# Patient Record
Sex: Female | Born: 1970
Health system: Southern US, Community
[De-identification: ages and names within clinical notes are randomized; demographics above are authoritative.]

## PROBLEM LIST (undated history)

## (undated) DIAGNOSIS — F32A Depression, unspecified: Secondary | ICD-10-CM

## (undated) DIAGNOSIS — R32 Unspecified urinary incontinence: Secondary | ICD-10-CM

## (undated) DIAGNOSIS — J439 Emphysema, unspecified: Secondary | ICD-10-CM

## (undated) DIAGNOSIS — M1990 Unspecified osteoarthritis, unspecified site: Secondary | ICD-10-CM

## (undated) DIAGNOSIS — J301 Allergic rhinitis due to pollen: Secondary | ICD-10-CM

## (undated) DIAGNOSIS — F4310 Post-traumatic stress disorder, unspecified: Secondary | ICD-10-CM

## (undated) DIAGNOSIS — Z21 Asymptomatic human immunodeficiency virus [HIV] infection status: Secondary | ICD-10-CM

## (undated) DIAGNOSIS — F329 Major depressive disorder, single episode, unspecified: Secondary | ICD-10-CM

## (undated) DIAGNOSIS — A630 Anogenital (venereal) warts: Secondary | ICD-10-CM

## (undated) DIAGNOSIS — T7840XA Allergy, unspecified, initial encounter: Secondary | ICD-10-CM

## (undated) DIAGNOSIS — F603 Borderline personality disorder: Secondary | ICD-10-CM

## (undated) DIAGNOSIS — E079 Disorder of thyroid, unspecified: Secondary | ICD-10-CM

## (undated) DIAGNOSIS — R519 Headache, unspecified: Secondary | ICD-10-CM

## (undated) DIAGNOSIS — E119 Type 2 diabetes mellitus without complications: Secondary | ICD-10-CM

## (undated) DIAGNOSIS — R51 Headache: Secondary | ICD-10-CM

## (undated) DIAGNOSIS — J45909 Unspecified asthma, uncomplicated: Secondary | ICD-10-CM

## (undated) DIAGNOSIS — B20 Human immunodeficiency virus [HIV] disease: Secondary | ICD-10-CM

## (undated) HISTORY — DX: Depression, unspecified: F32.A

## (undated) HISTORY — DX: Borderline personality disorder: F60.3

## (undated) HISTORY — DX: Headache: R51

## (undated) HISTORY — DX: Emphysema, unspecified: J43.9

## (undated) HISTORY — DX: Unspecified urinary incontinence: R32

## (undated) HISTORY — DX: Major depressive disorder, single episode, unspecified: F32.9

## (undated) HISTORY — DX: Headache, unspecified: R51.9

## (undated) HISTORY — DX: Asymptomatic human immunodeficiency virus (hiv) infection status: Z21

## (undated) HISTORY — DX: Disorder of thyroid, unspecified: E07.9

## (undated) HISTORY — DX: Unspecified osteoarthritis, unspecified site: M19.90

## (undated) HISTORY — DX: Anogenital (venereal) warts: A63.0

## (undated) HISTORY — DX: Human immunodeficiency virus (HIV) disease: B20

## (undated) HISTORY — DX: Allergy, unspecified, initial encounter: T78.40XA

## (undated) HISTORY — DX: Type 2 diabetes mellitus without complications: E11.9

## (undated) HISTORY — DX: Post-traumatic stress disorder, unspecified: F43.10

## (undated) HISTORY — DX: Allergic rhinitis due to pollen: J30.1

## (undated) HISTORY — DX: Unspecified asthma, uncomplicated: J45.909

---

## 1994-10-28 HISTORY — PX: TUBAL LIGATION: SHX77

## 2014-12-08 DIAGNOSIS — F4310 Post-traumatic stress disorder, unspecified: Secondary | ICD-10-CM | POA: Diagnosis not present

## 2015-04-17 DIAGNOSIS — J40 Bronchitis, not specified as acute or chronic: Secondary | ICD-10-CM | POA: Diagnosis not present

## 2015-04-17 DIAGNOSIS — F17200 Nicotine dependence, unspecified, uncomplicated: Secondary | ICD-10-CM | POA: Diagnosis not present

## 2015-04-17 DIAGNOSIS — J449 Chronic obstructive pulmonary disease, unspecified: Secondary | ICD-10-CM | POA: Diagnosis not present

## 2015-04-17 DIAGNOSIS — J45909 Unspecified asthma, uncomplicated: Secondary | ICD-10-CM | POA: Diagnosis not present

## 2015-04-17 DIAGNOSIS — Z880 Allergy status to penicillin: Secondary | ICD-10-CM | POA: Diagnosis not present

## 2015-04-17 DIAGNOSIS — R05 Cough: Secondary | ICD-10-CM | POA: Diagnosis not present

## 2015-07-27 DIAGNOSIS — S93402A Sprain of unspecified ligament of left ankle, initial encounter: Secondary | ICD-10-CM | POA: Diagnosis not present

## 2015-07-27 DIAGNOSIS — M79672 Pain in left foot: Secondary | ICD-10-CM | POA: Diagnosis not present

## 2015-07-27 DIAGNOSIS — S0990XA Unspecified injury of head, initial encounter: Secondary | ICD-10-CM | POA: Diagnosis not present

## 2015-07-27 DIAGNOSIS — S14109A Unspecified injury at unspecified level of cervical spinal cord, initial encounter: Secondary | ICD-10-CM | POA: Diagnosis not present

## 2015-07-27 DIAGNOSIS — M25572 Pain in left ankle and joints of left foot: Secondary | ICD-10-CM | POA: Diagnosis not present

## 2015-07-27 DIAGNOSIS — S79912A Unspecified injury of left hip, initial encounter: Secondary | ICD-10-CM | POA: Diagnosis not present

## 2015-07-27 DIAGNOSIS — S161XXA Strain of muscle, fascia and tendon at neck level, initial encounter: Secondary | ICD-10-CM | POA: Diagnosis not present

## 2015-07-27 DIAGNOSIS — S7002XA Contusion of left hip, initial encounter: Secondary | ICD-10-CM | POA: Diagnosis not present

## 2016-05-30 ENCOUNTER — Ambulatory Visit (INDEPENDENT_AMBULATORY_CARE_PROVIDER_SITE_OTHER): Payer: Medicare Other | Admitting: Family Medicine

## 2016-05-30 ENCOUNTER — Encounter: Payer: Self-pay | Admitting: Family Medicine

## 2016-05-30 VITALS — BP 132/90 | HR 71 | Temp 98.2°F | Resp 12 | Ht 64.0 in | Wt 209.0 lb

## 2016-05-30 DIAGNOSIS — F411 Generalized anxiety disorder: Secondary | ICD-10-CM | POA: Insufficient documentation

## 2016-05-30 DIAGNOSIS — F609 Personality disorder, unspecified: Secondary | ICD-10-CM

## 2016-05-30 DIAGNOSIS — E039 Hypothyroidism, unspecified: Secondary | ICD-10-CM

## 2016-05-30 DIAGNOSIS — E1149 Type 2 diabetes mellitus with other diabetic neurological complication: Secondary | ICD-10-CM | POA: Insufficient documentation

## 2016-05-30 DIAGNOSIS — J4520 Mild intermittent asthma, uncomplicated: Secondary | ICD-10-CM

## 2016-05-30 MED ORDER — ALBUTEROL SULFATE HFA 108 (90 BASE) MCG/ACT IN AERS: 2.0000 | INHALATION_SPRAY | Freq: Four times a day (QID) | RESPIRATORY_TRACT | 2 refills | Status: DC | PRN

## 2016-05-30 MED ORDER — MONTELUKAST SODIUM 10 MG PO TABS: 10.0000 mg | ORAL_TABLET | Freq: Every day | ORAL | 3 refills | Status: DC

## 2016-05-30 MED ORDER — LAMOTRIGINE 100 MG PO TABS: 100.0000 mg | ORAL_TABLET | Freq: Two times a day (BID) | ORAL | 2 refills | Status: DC

## 2016-05-30 MED ORDER — QUETIAPINE FUMARATE 200 MG PO TABS: 200.0000 mg | ORAL_TABLET | Freq: Every day | ORAL | 1 refills | Status: DC
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
## 2016-05-30 NOTE — Progress Notes (Signed)
Pre visit review using our clinic review tool, if applicable. No additional management support is needed unless otherwise documented below in the visit note. 

## 2016-05-30 NOTE — Patient Instructions (Addendum)
A few things to remember from today's visit:   Personality disorder - Plan: Ambulatory referral to Psychiatry  Generalized anxiety disorder - Plan: Ambulatory referral to Psychiatry  Diabetes mellitus type 2 with neurological manifestations (HCC) - Plan: Hemoglobin A1c, Comprehensive metabolic panel, Microalbumin/Creatinine Ratio, Urine  Hypothyroidism, unspecified hypothyroidism type - Plan: TSH, T4, free   We have ordered labs or studies at this visit.  It can take up to 1-2 weeks for results and processing. IF results require follow up or explanation, we will call you with instructions. Clinically stable results will be released to your [facility]. If you have not heard from [place] or cannot find your results in [facility] in 2 weeks please contact our office at [phone].  If you are not yet signed up for [facility], please consider signing up   Please be sure medication list is accurate. If a new problem present, please set up appointment sooner than planned today.

## 2016-06-12 ENCOUNTER — Other Ambulatory Visit: Payer: Medicare Other

## 2016-06-17 ENCOUNTER — Other Ambulatory Visit: Payer: Medicare Other

## 2016-07-12 ENCOUNTER — Ambulatory Visit: Payer: Medicare Other | Admitting: Family Medicine

## 2016-07-12 DIAGNOSIS — Z0289 Encounter for other administrative examinations: Secondary | ICD-10-CM

## 2016-07-25 ENCOUNTER — Telehealth: Payer: Self-pay | Admitting: Family Medicine

## 2016-07-25 DIAGNOSIS — F609 Personality disorder, unspecified: Secondary | ICD-10-CM

## 2016-07-25 MED ORDER — QUETIAPINE FUMARATE 200 MG PO TABS: 200.0000 mg | ORAL_TABLET | Freq: Every day | ORAL | 0 refills | Status: DC

## 2016-07-25 NOTE — Telephone Encounter (Signed)
Rx sent 

## 2016-07-25 NOTE — Telephone Encounter (Signed)
° °  Pt request refill of the following:     QUEtiapine (SEROQUEL) 200 MG tablet   Phamacy:  [facility]

## 2016-07-30 ENCOUNTER — Encounter: Payer: Self-pay | Admitting: Family Medicine

## 2016-07-30 ENCOUNTER — Ambulatory Visit (INDEPENDENT_AMBULATORY_CARE_PROVIDER_SITE_OTHER): Payer: Medicare Other | Admitting: Family Medicine

## 2016-07-30 ENCOUNTER — Other Ambulatory Visit (HOSPITAL_COMMUNITY)
Admission: RE | Admit: 2016-07-30 | Discharge: 2016-07-30 | Disposition: A | Discharge status: Home / Self Care | Payer: Medicare Other | Source: Ambulatory Visit | Attending: Family Medicine | Admitting: Family Medicine

## 2016-07-30 VITALS — BP 140/90 | HR 98 | Resp 12 | Ht 64.0 in | Wt 217.5 lb

## 2016-07-30 DIAGNOSIS — Z1239 Encounter for other screening for malignant neoplasm of breast: Secondary | ICD-10-CM

## 2016-07-30 DIAGNOSIS — Z124 Encounter for screening for malignant neoplasm of cervix: Secondary | ICD-10-CM

## 2016-07-30 DIAGNOSIS — N898 Other specified noninflammatory disorders of vagina: Secondary | ICD-10-CM | POA: Diagnosis not present

## 2016-07-30 DIAGNOSIS — Z01419 Encounter for gynecological examination (general) (routine) without abnormal findings: Secondary | ICD-10-CM | POA: Insufficient documentation

## 2016-07-30 DIAGNOSIS — E039 Hypothyroidism, unspecified: Secondary | ICD-10-CM | POA: Diagnosis not present

## 2016-07-30 DIAGNOSIS — Z1151 Encounter for screening for human papillomavirus (HPV): Secondary | ICD-10-CM | POA: Diagnosis not present

## 2016-07-30 DIAGNOSIS — F411 Generalized anxiety disorder: Secondary | ICD-10-CM

## 2016-07-30 DIAGNOSIS — Z114 Encounter for screening for human immunodeficiency virus [HIV]: Secondary | ICD-10-CM

## 2016-07-30 DIAGNOSIS — F609 Personality disorder, unspecified: Secondary | ICD-10-CM | POA: Diagnosis not present

## 2016-07-30 DIAGNOSIS — E1149 Type 2 diabetes mellitus with other diabetic neurological complication: Secondary | ICD-10-CM

## 2016-07-30 MED ORDER — LAMOTRIGINE 100 MG PO TABS: 100.0000 mg | ORAL_TABLET | Freq: Two times a day (BID) | ORAL | 1 refills | Status: AC

## 2016-07-30 MED ORDER — GABAPENTIN 600 MG PO TABS: 600.0000 mg | ORAL_TABLET | Freq: Three times a day (TID) | ORAL | 2 refills | Status: AC

## 2016-07-30 MED ORDER — QUETIAPINE FUMARATE 200 MG PO TABS
200.0000 mg | ORAL_TABLET | Freq: Every day | ORAL | 1 refills | Status: AC
Start: 2016-07-30 — End: ?

## 2016-07-30 NOTE — Patient Instructions (Addendum)
A few things to remember from today's visit:   Routine general medical examination at a health care facility - Plan: Pap IG and HPV (high risk) DNA detection, Chlamydia/Gonococcus/Trichomonas, NAA  Diabetes mellitus type 2 with neurological manifestations (HCC) - Plan: CMP, Hemoglobin A1c, Microalbumin/Creatinine Ratio, Urine, Lipid panel  Hypothyroidism, unspecified type - Plan: CMP, Lipid panel, TSH, T4, free, CBC  Personality disorder  Vaginal discharge - Plan: Chlamydia/Gonococcus/Trichomonas, NAA  Cervical cancer screening - Plan: Pap IG and HPV (high risk) DNA detection  Encounter for screening for HIV - Plan: HIV antibody (with reflex)    At least 150 minutes of moderate exercise per week, daily brisk walking for 15-30 min is a good exercise option. Healthy diet low in saturated (animal) fats and sweets and consisting of fresh fruits and vegetables, lean meats such as fish and white chicken and whole grains.   - Vaccines:  Tdap vaccine every 10 years.  Shingles vaccine recommended at age 43, could be given after 43 years of age but not sure about insurance coverage.  Pneumonia vaccines:  Prevnar 13 at 65 and Pneumovax at 66.  Screening recommendations for low/normal risk women:  Screening for diabetes at age 43-45 and every 3 years.  Cervical cancer prevention:  -HPV vaccination between 709-43 years old. -Pap smear starts at 43 years of age and continues periodically until 43 years old in low risk women. Pap smear every 3 years between 3021 and 582 years old. Pap smear every 3 years between women 30 and older if pap smear negative and HPV screening negative.   -Breast cancer: Mammogram: There is disagreement between experts about when to start screening in low risk asymptomatic female but recent recommendations are to start screening at 1140 and not later than 43 years old , every 1-2 years and after 43 yo q 2 years. Screening is recommended until 43 years old but some women  can continue screening depending of healthy issues.   Colon cancer screening: starts at 43 years old until 10963 years old.  Cholesterol disorder screening at age 43 and every 3 years.   Please call and schedule appt: [facility]  Address: [address], [place], [place] [address]  Hours:  Closing soon  8:30AM-5PM                       Phone: [phone]   Please be sure medication list is accurate. If a new problem present, please set up appointment sooner than planned today.

## 2016-07-30 NOTE — Progress Notes (Signed)
HPI:    [name] is a 45 y.o. female, who is here today to follow on last OV and also  for her routine physical/gyn examination.   She is also following on her last OV, Hx of personality disorder and generalized anxiety/PSTD, last OV (05/30/16) Seroquel and Lamictal were resumed. + Marijuana use.  Referral to psychiatrists was placed, she has not established with psychiatrists yet. Sleeping better (4 hours), still having her "mind racing", denies suicidal ideation. + Irritability. She states that she was on Clonazepam in the past and it helped.  Reporting poor concentration, anxiety, and tremor; all these stable. Today she mentions Hx of seizures "many years" ago, not sure about etiology and she is not clear if she really had seizures, "shaking."  Also she states that she was supposed to be on gabapentin 600 mg bid for anxiety and insomnia. Tolerating medications well, no side effect reported.    Also Hx of DM II diet controlled, asthma/COPD, and hypothyroidism among some. Last OV she did not have time to have labs done. Hx of hypothyroidism, she has not been on treatment for over a year.  She does not exercise regularly. She follows a healthy diet, father also has DM II.    She lives with father, moved with him in 03/2016. She is on disability due to psychiatric disorder.    Last Pap smear 3 years ago Genital warts, treated twice (perianal and vulvar), she has some residual lesions, not growing, not tender or pruritic.Also reporting scarring changes on site of perianal warts removal, she thinks this may be causing a "blockage" when trying to have a bowel movement. Hx of constipation, no changes in bowel habits, dyschezia, or blood in stool.   Hx of abnormal pap smear: Yes, about 21 years ago. Hx of STD's: HPV. Last HIV 3 years ago, she would like another one done. LMP: 07/17/16.  Currently she is not sexually active, she has had unprotected sex a few  months ago. She denies vaginal discharge or vaginal bleeding. She would like STD screening done.   Mammogram: never Tdap 10/2015.   FHx for gynecologic cancer: mother breast cancer FHx for colon cancer: grandparents (?) She is also requesting referral for eye exam.    Review of Systems  Constitutional: Negative for appetite change, fatigue, fever and unexpected weight change.  HENT: Negative for hearing loss, mouth sores, nosebleeds, sore throat, trouble swallowing and voice change.   Eyes: Negative for photophobia, pain and visual disturbance.  Respiratory: Negative for cough, shortness of breath and wheezing.   Cardiovascular: Negative for chest pain, palpitations and leg swelling.  Gastrointestinal: Positive for constipation. Negative for abdominal pain, blood in stool, nausea and vomiting.       No changes in bowel habits.  Endocrine: Negative for cold intolerance, heat intolerance, polydipsia, polyphagia and polyuria.  Genitourinary: Negative for decreased urine volume, difficulty urinating, dysuria, genital sores, hematuria, menstrual problem, vaginal bleeding and vaginal discharge.       Occasional stress urine incontinence. No breast tenderness or nipple discharge.  Musculoskeletal: Positive for arthralgias. Negative for gait problem and myalgias.  Skin: Negative for color change and rash.  Allergic/Immunologic: Positive for environmental allergies.  Neurological: Positive for tremors (hands mainly, stable) and numbness (occasional:feet and hands). Negative for seizures, syncope, speech difficulty, weakness and headaches.  Hematological: Negative for adenopathy. Does not bruise/bleed easily.  Psychiatric/Behavioral: Positive for decreased concentration, dysphoric mood and sleep disturbance. Negative for confusion, hallucinations and  suicidal ideas. The patient is nervous/anxious.   All other systems reviewed and are negative.     Current Outpatient Prescriptions on File  Prior to Visit  Medication Sig Dispense Refill  . albuterol (PROVENTIL HFA;VENTOLIN HFA) 108 (90 Base) MCG/ACT inhaler Inhale 2 puffs into the lungs every 6 (six) hours as needed for wheezing or shortness of breath. 1 Inhaler 2  . montelukast (SINGULAIR) 10 MG tablet Take 1 tablet (10 mg total) by mouth at bedtime. 90 tablet 3   No current facility-administered medications on file prior to visit.      Past Medical History:  Diagnosis Date  . Allergy   . Arthritis   . Asthma   . Borderline personality disorder   . Depression   . Diabetes mellitus without complication (HCC)   . Emphysema of lung (HCC)   . Frequent headaches   . Genital warts   . Hay fever   . HIV infection (HCC)    testing   . PTSD (post-traumatic stress disorder)   . Thyroid disease   . Urine incontinence    Not on File Family History  Problem Relation Age of Onset  . Arthritis Mother   . Hypertension Mother   . Diabetes Mother   . Arthritis Father   . Hypertension Father   . Diabetes Father   . Alcohol abuse Maternal Grandmother   . Cancer Maternal Grandmother   . Hypertension Maternal Grandmother   . Diabetes Maternal Grandmother   . Alcohol abuse Maternal Grandfather   . Cancer Maternal Grandfather   . Hypertension Maternal Grandfather   . Diabetes Maternal Grandfather   . Cancer Paternal Grandmother   . Hypertension Paternal Grandmother   . Diabetes Paternal Grandmother   . Cancer Paternal Grandfather   . Hypertension Paternal Grandfather   . Diabetes Paternal Grandfather     Social History   Social History  . Marital status: Single    Spouse name: N/A  . Number of children: N/A  . Years of education: N/A   Social History Main Topics  . Smoking status: Current Every Day Smoker    Types: Cigarettes  . Smokeless tobacco: None  . Alcohol use None  . Drug use:     Types: Marijuana  . Sexual activity: Not Asked   Other Topics Concern  . None   Social History Narrative  . None     Vitals:   07/30/16 1529  BP: 140/90  Pulse: 98  Resp: 12    O2 sat at RA 99%.   Body mass index is 37.33 kg/m.  Wt Readings from Last 3 Encounters:  07/30/16 217 lb 8 oz (98.7 kg)  05/30/16 209 lb (94.8 kg)       Physical Exam  Nursing note and vitals reviewed. Constitutional: She is oriented to person, place, and time. She appears well-developed. No distress.  HENT:  Head: Atraumatic.  Right Ear: Hearing, tympanic membrane, external ear and ear canal normal.  Left Ear: Hearing, tympanic membrane, external ear and ear canal normal.  Mouth/Throat: Uvula is midline, oropharynx is clear and moist and mucous membranes are normal.  Poor dentition   Eyes: Conjunctivae and EOM are normal. Pupils are equal, round, and reactive to light.  Neck: No thyroid mass and no thyromegaly (palpable) present.  Cardiovascular: Normal rate and regular rhythm.   No murmur heard. Pulses:      Dorsalis pedis pulses are 2+ on the right side, and 2+ on the left side.  Posterior tibial pulses are 2+ on the right side, and 2+ on the left side.  Respiratory: Effort normal and breath sounds normal. No respiratory distress.  GI: Soft. She exhibits no mass. There is no hepatomegaly. There is no tenderness.  Genitourinary: No breast swelling, tenderness or discharge. There is lesion (papular lesions x 2, no erythematous, sebaceous like, 2 mm) on the right labia. There is no rash or tenderness on the right labia. There is no rash or tenderness on the left labia. Uterus is not enlarged and not tender. Cervix exhibits discharge. Cervix exhibits no motion tenderness and no friability. Right adnexum displays no mass, no tenderness and no fullness. Left adnexum displays no mass, no tenderness and no fullness. There is tenderness in the vagina. No erythema or bleeding in the vagina. Vaginal discharge (whitish, non odorous) found.  Genitourinary Comments: Fibrocystic like changes upper outer quadrant  breast L>R. Pap smear collected.  Musculoskeletal: She exhibits no edema or tenderness.  Lymphadenopathy:    She has no cervical adenopathy.    She has no axillary adenopathy.       Right: No supraclavicular adenopathy present.       Left: No supraclavicular adenopathy present.  Neurological: She is alert and oriented to person, place, and time. She has normal strength. No cranial nerve deficit. Coordination and gait normal.  Reflex Scores:      Bicep reflexes are 2+ on the right side and 2+ on the left side.      Patellar reflexes are 2+ on the right side and 2+ on the left side. Skin: Skin is warm. No rash noted. No erythema.  Psychiatric: She has a normal mood and affect. Her speech is rapid and/or pressured. Cognition and memory are normal.  Well groomed, good eye contact.      ASSESSMENT AND PLAN:     Katherine Lloyd was seen today for follow-up.  Diagnoses and all orders for this visit:  Encounter for annual routine gynecological examination   We discussed the importance of regular physical activity and healthy diet for prevention of chronic illness and/or complications. Preventive guidelines reviewed. Vaccination up to date, she would like flu vaccine tomorrow. Next gyn examination in 1-3 years.  -     Chlamydia/Gonococcus/Trichomonas, NAA -     MM SCREENING BREAST TOMO BILATERAL; Future  Diabetes mellitus type 2 with neurological manifestations (HCC)  Continue nonpharmacologic treatment. Periodic eye examination, referral placed. Foot care and regular visits with dentists. Further recommendations would be given according to lab results.   -     CMP; Future -     Hemoglobin A1c; Future -     Microalbumin/Creatinine Ratio, Urine; Future -     Lipid panel; Future  Hypothyroidism, unspecified type  Will follow labs done today and will give further recommendations accordingly. She gained about 8 pounds sine her last OV, 05/2016.   -     CMP; Future -     Lipid  panel; Future -     TSH; Future -     T4, free; Future -     CBC; Future  Personality disorder  Some improvement with current management. Information about [facility] given today, so she can call and arrange an appt.  -     QUEtiapine (SEROQUEL) 200 MG tablet; Take 1 tablet (200 mg total) by mouth at bedtime. -     lamoTRIgine (LAMICTAL) 100 MG tablet; Take 1 tablet (100 mg total) by mouth 2 (two) times daily.  Vaginal discharge  STD prevention discussed. Further recommendations will be given according to lab results.  -     Chlamydia/Gonococcus/Trichomonas, NAA  Cervical cancer screening  -     PAP [[place]]  Encounter for screening for HIV -     HIV antibody (with reflex); Future  Generalized anxiety disorder  Still not well controlled. Resume Gabapentin 600 mg bid, some side effects discussed. Continue following with psychiatrists. Some side effects of medication discussed.   -     gabapentin (NEURONTIN) 600 MG tablet; Take 1 tablet (600 mg total) by mouth 3 (three) times daily.  Breast cancer screening -     MM SCREENING BREAST TOMO BILATERAL; Future       Face to face OV 3:38 pm - 4:46 pm. >50% of time dedicated to explaining current guidelines for preventive care, she is talkative and anxious about current medical problems and past high risk behaviors, "poor decisions ",  concerned about prior Hx of genital warts, offered gyn evaluation but she refused.             Katherine Jurgens G. [place], MD  [facility]. [place] office.

## 2016-07-30 NOTE — Progress Notes (Deleted)
       Review of Systems    Current Outpatient Prescriptions on File Prior to Visit  Medication Sig Dispense Refill  . albuterol (PROVENTIL HFA;VENTOLIN HFA) 108 (90 Base) MCG/ACT inhaler Inhale 2 puffs into the lungs every 6 (six) hours as needed for wheezing or shortness of breath. 1 Inhaler 2  . montelukast (SINGULAIR) 10 MG tablet Take 1 tablet (10 mg total) by mouth at bedtime. 90 tablet 3   No current facility-administered medications on file prior to visit.      Past Medical History:  Diagnosis Date  . Allergy   . Arthritis   . Asthma   . Borderline personality disorder   . Depression   . Diabetes mellitus without complication (HCC)   . Emphysema of lung (HCC)   . Frequent headaches   . Genital warts   . Hay fever   . HIV infection (HCC)    testing   . PTSD (post-traumatic stress disorder)   . Thyroid disease   . Urine incontinence    Not on File  Social History   Social History  . Marital status: Single    Spouse name: N/A  . Number of children: N/A  . Years of education: N/A   Social History Main Topics  . Smoking status: Current Every Day Smoker    Types: Cigarettes  . Smokeless tobacco: None  . Alcohol use None  . Drug use:     Types: Marijuana  . Sexual activity: Not Asked   Other Topics Concern  . None   Social History Narrative  . None    Vitals:   07/30/16 1529  BP: 140/90  Pulse: 98  Resp: 12   Body mass index is 37.33 kg/m.      Physical Exam

## 2016-07-30 NOTE — Progress Notes (Deleted)
       Review of Systems    Current Outpatient Prescriptions on File Prior to Visit  Medication Sig Dispense Refill  . albuterol (PROVENTIL HFA;VENTOLIN HFA) 108 (90 Base) MCG/ACT inhaler Inhale 2 puffs into the lungs every 6 (six) hours as needed for wheezing or shortness of breath. 1 Inhaler 2  . lamoTRIgine (LAMICTAL) 100 MG tablet Take 1 tablet (100 mg total) by mouth 2 (two) times daily. 60 tablet 2  . montelukast (SINGULAIR) 10 MG tablet Take 1 tablet (10 mg total) by mouth at bedtime. 90 tablet 3  . QUEtiapine (SEROQUEL) 200 MG tablet Take 1 tablet (200 mg total) by mouth at bedtime. 30 tablet 0   No current facility-administered medications on file prior to visit.      Past Medical History:  Diagnosis Date  . Allergy   . Arthritis   . Asthma   . Borderline personality disorder   . Depression   . Diabetes mellitus without complication (HCC)   . Emphysema of lung (HCC)   . Frequent headaches   . Genital warts   . Hay fever   . HIV infection (HCC)    testing   . PTSD (post-traumatic stress disorder)   . Thyroid disease   . Urine incontinence    Not on File  Social History   Social History  . Marital status: Single    Spouse name: N/A  . Number of children: N/A  . Years of education: N/A   Social History Main Topics  . Smoking status: Current Every Day Smoker    Types: Cigarettes  . Smokeless tobacco: None  . Alcohol use None  . Drug use:     Types: Marijuana  . Sexual activity: Not Asked   Other Topics Concern  . None   Social History Narrative  . None    Vitals:   07/30/16 1529  BP: 140/90  Pulse: 98   Body mass index is 37.33 kg/m.      Physical Exam

## 2016-07-30 NOTE — Progress Notes (Signed)
Pre visit review using our clinic review tool, if applicable. No additional management support is needed unless otherwise documented below in the visit note. 

## 2016-07-31 ENCOUNTER — Other Ambulatory Visit (INDEPENDENT_AMBULATORY_CARE_PROVIDER_SITE_OTHER): Payer: Medicare Other

## 2016-07-31 DIAGNOSIS — N898 Other specified noninflammatory disorders of vagina: Secondary | ICD-10-CM | POA: Diagnosis not present

## 2016-07-31 DIAGNOSIS — E039 Hypothyroidism, unspecified: Secondary | ICD-10-CM | POA: Diagnosis not present

## 2016-07-31 DIAGNOSIS — Z114 Encounter for screening for human immunodeficiency virus [HIV]: Secondary | ICD-10-CM | POA: Diagnosis not present

## 2016-07-31 DIAGNOSIS — E1149 Type 2 diabetes mellitus with other diabetic neurological complication: Secondary | ICD-10-CM | POA: Diagnosis not present

## 2016-07-31 DIAGNOSIS — Z01419 Encounter for gynecological examination (general) (routine) without abnormal findings: Secondary | ICD-10-CM | POA: Diagnosis not present

## 2016-07-31 LAB — COMPREHENSIVE METABOLIC PANEL
ALT: 13 U/L (ref 0–35)
AST: 15 U/L (ref 0–37)
Albumin: 3.7 g/dL (ref 3.5–5.2)
Alkaline Phosphatase: 59 U/L (ref 39–117)
BILIRUBIN TOTAL: 0.5 mg/dL (ref 0.2–1.2)
BUN: 16 mg/dL (ref 6–23)
CO2: 28 meq/L (ref 19–32)
Calcium: 9.1 mg/dL (ref 8.4–10.5)
Chloride: 105 mEq/L (ref 96–112)
Creatinine, Ser: 0.75 mg/dL (ref 0.40–1.20)
GFR: 89.44 mL/min (ref >60.00)
GLUCOSE: 86 mg/dL (ref 70–99)
Potassium: 4.4 mEq/L (ref 3.5–5.1)
Sodium: 141 mEq/L (ref 135–145)
Total Protein: 7.4 g/dL (ref 6.0–8.3)

## 2016-07-31 LAB — LIPID PANEL
CHOL/HDL RATIO: 2
Cholesterol: 104 mg/dL (ref 0–200)
HDL: 61.2 mg/dL (ref >39.00)
LDL Cholesterol: 34 mg/dL (ref 0–99)
NonHDL: 43.05
TRIGLYCERIDES: 47 mg/dL (ref 0.0–149.0)
VLDL: 9.4 mg/dL (ref 0.0–40.0)

## 2016-07-31 LAB — MICROALBUMIN / CREATININE URINE RATIO
CREATININE, U: 94.3 mg/dL
MICROALB/CREAT RATIO: 0.7 mg/g (ref 0.0–30.0)
Microalb, Ur: <0.7 mg/dL (ref 0.0–1.9)

## 2016-07-31 LAB — TSH: TSH: 9.38 u[IU]/mL — ABNORMAL HIGH (ref 0.35–4.50)

## 2016-07-31 LAB — CBC
HEMATOCRIT: 44.6 % (ref 36.0–46.0)
HEMOGLOBIN: 15.6 g/dL — AB (ref 12.0–15.0)
MCHC: 35.1 g/dL (ref 30.0–36.0)
MCV: 91.2 fl (ref 78.0–100.0)
PLATELETS: 262 10*3/uL (ref 150.0–400.0)
RBC: 4.89 Mil/uL (ref 3.87–5.11)
RDW: 13.1 % (ref 11.5–15.5)
WBC: 10 10*3/uL (ref 4.0–10.5)

## 2016-07-31 LAB — HEMOGLOBIN A1C: Hgb A1c MFr Bld: 5.5 % (ref 4.6–6.5)

## 2016-07-31 LAB — T4, FREE: Free T4: 0.63 ng/dL (ref 0.60–1.60)

## 2016-08-01 ENCOUNTER — Other Ambulatory Visit: Payer: Self-pay | Admitting: Family Medicine

## 2016-08-01 DIAGNOSIS — E039 Hypothyroidism, unspecified: Secondary | ICD-10-CM

## 2016-08-01 LAB — HIV ANTIBODY (ROUTINE TESTING W REFLEX): HIV: NONREACTIVE

## 2016-08-01 LAB — CYTOLOGY - PAP

## 2016-08-01 MED ORDER — LEVOTHYROXINE SODIUM 25 MCG PO TABS: 25.0000 ug | ORAL_TABLET | Freq: Every day | ORAL | 3 refills | Status: DC

## 2016-08-02 ENCOUNTER — Ambulatory Visit (INDEPENDENT_AMBULATORY_CARE_PROVIDER_SITE_OTHER): Payer: Medicare Other

## 2016-08-02 DIAGNOSIS — Z23 Encounter for immunization: Secondary | ICD-10-CM | POA: Diagnosis not present

## 2016-08-05 ENCOUNTER — Other Ambulatory Visit: Payer: Self-pay | Admitting: Family Medicine

## 2016-08-05 DIAGNOSIS — Z1231 Encounter for screening mammogram for malignant neoplasm of breast: Secondary | ICD-10-CM

## 2016-08-09 ENCOUNTER — Telehealth: Payer: Self-pay

## 2016-08-09 NOTE — Telephone Encounter (Signed)
Called to let patient know that Chlamydia, Gonococcus, and Trich tests were negative. Left voicemail letting patient know tests were negative and to call back if she had any questions.

## 2016-08-14 ENCOUNTER — Ambulatory Visit: Payer: Medicare Other

## 2016-08-23 DIAGNOSIS — F4310 Post-traumatic stress disorder, unspecified: Secondary | ICD-10-CM | POA: Diagnosis not present

## 2016-08-23 DIAGNOSIS — F319 Bipolar disorder, unspecified: Secondary | ICD-10-CM | POA: Diagnosis not present

## 2016-10-08 DIAGNOSIS — F319 Bipolar disorder, unspecified: Secondary | ICD-10-CM | POA: Diagnosis not present

## 2016-10-11 ENCOUNTER — Emergency Department (HOSPITAL_COMMUNITY): Payer: Medicare Other

## 2016-10-11 ENCOUNTER — Encounter (HOSPITAL_COMMUNITY): Payer: Self-pay | Admitting: Emergency Medicine

## 2016-10-11 ENCOUNTER — Emergency Department (HOSPITAL_COMMUNITY)
Admission: EM | Admit: 2016-10-11 | Discharge: 2016-10-11 | Disposition: A | Discharge status: Home / Self Care | Payer: Medicare Other | Attending: Emergency Medicine | Admitting: Emergency Medicine

## 2016-10-11 DIAGNOSIS — E039 Hypothyroidism, unspecified: Secondary | ICD-10-CM | POA: Diagnosis not present

## 2016-10-11 DIAGNOSIS — G8929 Other chronic pain: Secondary | ICD-10-CM | POA: Diagnosis not present

## 2016-10-11 DIAGNOSIS — F17210 Nicotine dependence, cigarettes, uncomplicated: Secondary | ICD-10-CM | POA: Diagnosis not present

## 2016-10-11 DIAGNOSIS — M545 Low back pain, unspecified: Secondary | ICD-10-CM

## 2016-10-11 DIAGNOSIS — E119 Type 2 diabetes mellitus without complications: Secondary | ICD-10-CM | POA: Diagnosis not present

## 2016-10-11 DIAGNOSIS — M25552 Pain in left hip: Secondary | ICD-10-CM | POA: Diagnosis not present

## 2016-10-11 DIAGNOSIS — J45909 Unspecified asthma, uncomplicated: Secondary | ICD-10-CM | POA: Diagnosis not present

## 2016-10-11 DIAGNOSIS — M25561 Pain in right knee: Secondary | ICD-10-CM | POA: Diagnosis not present

## 2016-10-11 DIAGNOSIS — Z79899 Other long term (current) drug therapy: Secondary | ICD-10-CM | POA: Insufficient documentation

## 2016-10-11 DIAGNOSIS — N764 Abscess of vulva: Secondary | ICD-10-CM | POA: Insufficient documentation

## 2016-10-11 LAB — PREGNANCY, URINE: Preg Test, Ur: NEGATIVE

## 2016-10-11 LAB — URINALYSIS, ROUTINE W REFLEX MICROSCOPIC
Bilirubin Urine: NEGATIVE
Glucose, UA: NEGATIVE mg/dL
Hgb urine dipstick: NEGATIVE
Ketones, ur: NEGATIVE mg/dL
Leukocytes, UA: NEGATIVE
Nitrite: NEGATIVE
Protein, ur: NEGATIVE mg/dL
Specific Gravity, Urine: 1.006 (ref 1.005–1.030)
pH: 5 (ref 5.0–8.0)

## 2016-10-11 MED ORDER — METHOCARBAMOL 500 MG PO TABS: 500.0000 mg | ORAL_TABLET | Freq: Two times a day (BID) | ORAL | 0 refills | Status: AC

## 2016-10-11 MED ORDER — IBUPROFEN 800 MG PO TABS: 800.0000 mg | ORAL_TABLET | Freq: Three times a day (TID) | ORAL | 0 refills | Status: DC

## 2016-10-11 MED ORDER — CYCLOBENZAPRINE HCL 10 MG PO TABS: 10.0000 mg | ORAL_TABLET | Freq: Two times a day (BID) | ORAL | 0 refills | Status: DC | PRN

## 2016-10-11 MED ORDER — FLUCONAZOLE 150 MG PO TABS: 150.0000 mg | ORAL_TABLET | Freq: Every day | ORAL | 0 refills | Status: AC

## 2016-10-11 MED ORDER — SULFAMETHOXAZOLE-TRIMETHOPRIM 800-160 MG PO TABS: 1.0000 | ORAL_TABLET | Freq: Two times a day (BID) | ORAL | 0 refills | Status: AC

## 2016-10-11 MED ORDER — IBUPROFEN 800 MG PO TABS: 800.0000 mg | ORAL_TABLET | Freq: Three times a day (TID) | ORAL | 0 refills | Status: AC

## 2016-10-11 MED ORDER — SULFAMETHOXAZOLE-TRIMETHOPRIM 800-160 MG PO TABS: 1.0000 | ORAL_TABLET | Freq: Two times a day (BID) | ORAL | 0 refills | Status: DC
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
## 2016-10-11 NOTE — Discharge Instructions (Signed)
Medications: Bactrim, Robaxin, ibuprofen, Diflucan  Treatment: Take Bactrim as prescribed for 1 week. Make sure to finish all this medication. Take Robaxin twice daily as needed for muscle pain and spasms. Do not drive or operate machinery while taking this medication. Take ibuprofen every 8 hours for your pain. If you develop yeast infection, take 1 diflucan. If it is not resolved in 3 days, you can take another. Alternate ice and heat on your back and painful joints alternating 20 minutes on, 20 minutes off. Attempt the stretches attached as tolerated. Use warm compresses 3-4 times daily on your labial abscess, alternating 10 minutes on, 20 minutes off.  Follow-up: Please follow-up with your primary care provider if your symptoms are not improving. Please follow-up with orthopedics, Dr. Madelon Lipsaffrey, for further evaluation and treatment of your back and joint pain.

## 2016-10-11 NOTE — ED Provider Notes (Signed)
Medical screening examination/treatment/procedure(s) were conducted as a shared visit with non-physician practitioner(s) and myself.  I personally evaluated the patient during the encounter.  Patient with mulitple issues. Alex, PA did pelvic exama nd seems that their is no evidence of abscess or cellulitis at this time however has had these symptoms worsen into an infection in the past so will tx w/ abx.  All of her pains seem to be chronic with fluctuating courses. Doubt septic joints, gout or fractures at this time. Stable for out patient pcp follow up +/- ortho follow up.    Marily MemosJason Meena Barrantes, MD 10/11/16 1240

## 2016-10-11 NOTE — ED Triage Notes (Signed)
Patient reports abscess to labia x 3 days.  Patient also reports left hip pain and right knee pain.  States pain is "excruciating" when ambulatory.

## 2016-11-05 DIAGNOSIS — F319 Bipolar disorder, unspecified: Secondary | ICD-10-CM | POA: Diagnosis not present

## 2016-11-13 ENCOUNTER — Other Ambulatory Visit: Payer: Self-pay | Admitting: Family Medicine

## 2016-12-02 ENCOUNTER — Telehealth: Payer: Self-pay | Admitting: Family Medicine

## 2016-12-02 DIAGNOSIS — J4520 Mild intermittent asthma, uncomplicated: Secondary | ICD-10-CM

## 2016-12-02 MED ORDER — ALBUTEROL SULFATE HFA 108 (90 BASE) MCG/ACT IN AERS: 2.0000 | INHALATION_SPRAY | Freq: Four times a day (QID) | RESPIRATORY_TRACT | 2 refills | Status: DC | PRN

## 2016-12-02 MED ORDER — MONTELUKAST SODIUM 10 MG PO TABS: 10.0000 mg | ORAL_TABLET | Freq: Every day | ORAL | 1 refills | Status: AC

## 2016-12-02 MED ORDER — LEVOTHYROXINE SODIUM 25 MCG PO TABS: ORAL_TABLET | ORAL | 0 refills | Status: AC

## 2016-12-02 NOTE — Telephone Encounter (Signed)
Rxs sent

## 2016-12-02 NOTE — Telephone Encounter (Signed)
Pt need new Rx for levothyroxine, montelukast and PROVENTIL  [facility] [place]

## 2016-12-10 DIAGNOSIS — M5441 Lumbago with sciatica, right side: Secondary | ICD-10-CM | POA: Diagnosis not present

## 2016-12-14 DIAGNOSIS — M545 Low back pain: Secondary | ICD-10-CM | POA: Diagnosis not present

## 2017-02-04 ENCOUNTER — Ambulatory Visit: Payer: Medicaid Other | Admitting: Family Medicine

## 2017-04-11 DIAGNOSIS — E119 Type 2 diabetes mellitus without complications: Secondary | ICD-10-CM | POA: Diagnosis not present

## 2017-04-11 DIAGNOSIS — L0211 Cutaneous abscess of neck: Secondary | ICD-10-CM | POA: Diagnosis not present

## 2017-04-11 DIAGNOSIS — J449 Chronic obstructive pulmonary disease, unspecified: Secondary | ICD-10-CM | POA: Diagnosis not present

## 2017-04-11 DIAGNOSIS — Z880 Allergy status to penicillin: Secondary | ICD-10-CM | POA: Diagnosis not present

## 2017-04-11 DIAGNOSIS — R21 Rash and other nonspecific skin eruption: Secondary | ICD-10-CM | POA: Diagnosis not present

## 2017-05-03 ENCOUNTER — Other Ambulatory Visit: Payer: Self-pay | Admitting: Family Medicine

## 2017-05-03 DIAGNOSIS — J4520 Mild intermittent asthma, uncomplicated: Secondary | ICD-10-CM

## 2017-07-18 ENCOUNTER — Other Ambulatory Visit: Payer: Self-pay | Admitting: Family Medicine

## 2017-09-25 DIAGNOSIS — E039 Hypothyroidism, unspecified: Secondary | ICD-10-CM | POA: Diagnosis not present

## 2017-09-25 DIAGNOSIS — J449 Chronic obstructive pulmonary disease, unspecified: Secondary | ICD-10-CM | POA: Diagnosis not present

## 2017-09-25 DIAGNOSIS — M545 Low back pain: Secondary | ICD-10-CM | POA: Diagnosis not present

## 2017-09-25 DIAGNOSIS — Z880 Allergy status to penicillin: Secondary | ICD-10-CM | POA: Diagnosis not present

## 2017-09-25 DIAGNOSIS — G8929 Other chronic pain: Secondary | ICD-10-CM | POA: Diagnosis not present

## 2017-09-25 DIAGNOSIS — E1140 Type 2 diabetes mellitus with diabetic neuropathy, unspecified: Secondary | ICD-10-CM | POA: Diagnosis not present

## 2017-12-31 DIAGNOSIS — F603 Borderline personality disorder: Secondary | ICD-10-CM | POA: Diagnosis not present

## 2017-12-31 DIAGNOSIS — Z79899 Other long term (current) drug therapy: Secondary | ICD-10-CM | POA: Diagnosis not present

## 2017-12-31 DIAGNOSIS — E1142 Type 2 diabetes mellitus with diabetic polyneuropathy: Secondary | ICD-10-CM | POA: Diagnosis not present

## 2017-12-31 DIAGNOSIS — E039 Hypothyroidism, unspecified: Secondary | ICD-10-CM | POA: Diagnosis not present

## 2017-12-31 DIAGNOSIS — F411 Generalized anxiety disorder: Secondary | ICD-10-CM | POA: Diagnosis not present

## 2017-12-31 DIAGNOSIS — E669 Obesity, unspecified: Secondary | ICD-10-CM | POA: Diagnosis not present

## 2018-01-21 DIAGNOSIS — E039 Hypothyroidism, unspecified: Secondary | ICD-10-CM | POA: Diagnosis not present

## 2018-01-21 DIAGNOSIS — L03113 Cellulitis of right upper limb: Secondary | ICD-10-CM | POA: Diagnosis not present

## 2018-01-21 DIAGNOSIS — M5442 Lumbago with sciatica, left side: Secondary | ICD-10-CM | POA: Diagnosis not present

## 2018-01-21 DIAGNOSIS — R300 Dysuria: Secondary | ICD-10-CM | POA: Diagnosis not present

## 2018-03-02 DIAGNOSIS — G8929 Other chronic pain: Secondary | ICD-10-CM | POA: Diagnosis not present

## 2018-03-02 DIAGNOSIS — M5416 Radiculopathy, lumbar region: Secondary | ICD-10-CM | POA: Diagnosis not present

## 2018-03-02 DIAGNOSIS — M545 Low back pain: Secondary | ICD-10-CM | POA: Diagnosis not present

## 2018-03-04 DIAGNOSIS — J449 Chronic obstructive pulmonary disease, unspecified: Secondary | ICD-10-CM | POA: Diagnosis not present

## 2018-03-04 DIAGNOSIS — J45909 Unspecified asthma, uncomplicated: Secondary | ICD-10-CM | POA: Diagnosis not present

## 2018-03-04 DIAGNOSIS — F410 Panic disorder [episodic paroxysmal anxiety] without agoraphobia: Secondary | ICD-10-CM | POA: Diagnosis not present

## 2018-03-04 DIAGNOSIS — R509 Fever, unspecified: Secondary | ICD-10-CM | POA: Diagnosis not present

## 2018-03-04 DIAGNOSIS — G4730 Sleep apnea, unspecified: Secondary | ICD-10-CM | POA: Diagnosis not present

## 2018-03-04 DIAGNOSIS — R5381 Other malaise: Secondary | ICD-10-CM | POA: Diagnosis not present

## 2018-03-04 DIAGNOSIS — E119 Type 2 diabetes mellitus without complications: Secondary | ICD-10-CM | POA: Diagnosis not present

## 2018-03-04 DIAGNOSIS — F419 Anxiety disorder, unspecified: Secondary | ICD-10-CM | POA: Diagnosis not present

## 2018-03-04 DIAGNOSIS — L0231 Cutaneous abscess of buttock: Secondary | ICD-10-CM | POA: Diagnosis not present

## 2018-03-04 DIAGNOSIS — N764 Abscess of vulva: Secondary | ICD-10-CM | POA: Diagnosis not present

## 2018-03-04 DIAGNOSIS — Z880 Allergy status to penicillin: Secondary | ICD-10-CM | POA: Diagnosis not present

## 2018-03-04 DIAGNOSIS — Z79899 Other long term (current) drug therapy: Secondary | ICD-10-CM | POA: Diagnosis not present

## 2018-03-04 DIAGNOSIS — F17200 Nicotine dependence, unspecified, uncomplicated: Secondary | ICD-10-CM | POA: Diagnosis not present

## 2018-04-13 IMAGING — CR DG LUMBAR SPINE COMPLETE 4+V
5 series · 5 of 5 positions shown · non-contrast
Comparison: None.

CLINICAL DATA: Severe low back pain.  No known injury.

EXAM:
LUMBAR SPINE - COMPLETE 4+ VIEW

[t lumbar spine ap]
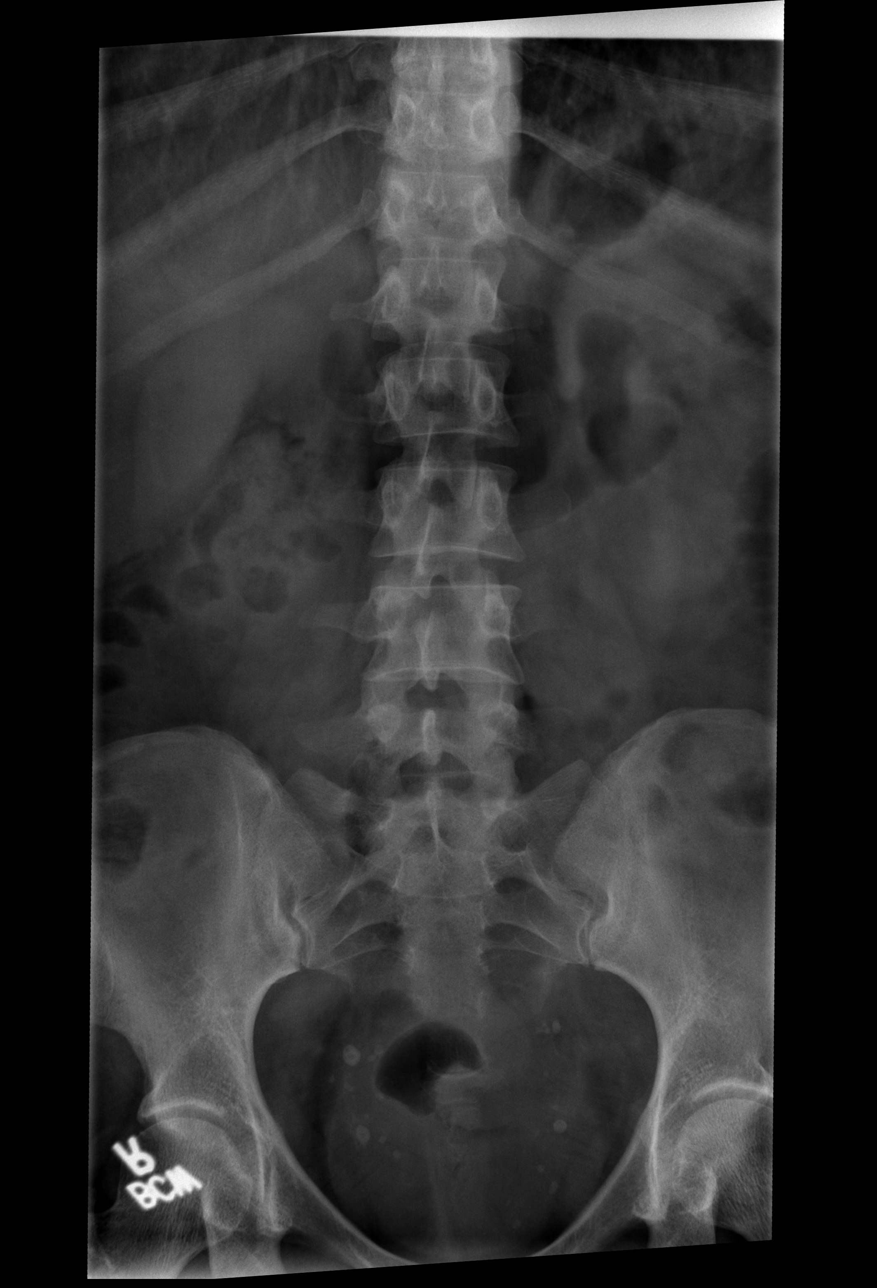

[t lumbar spine obl (1 of 2)]
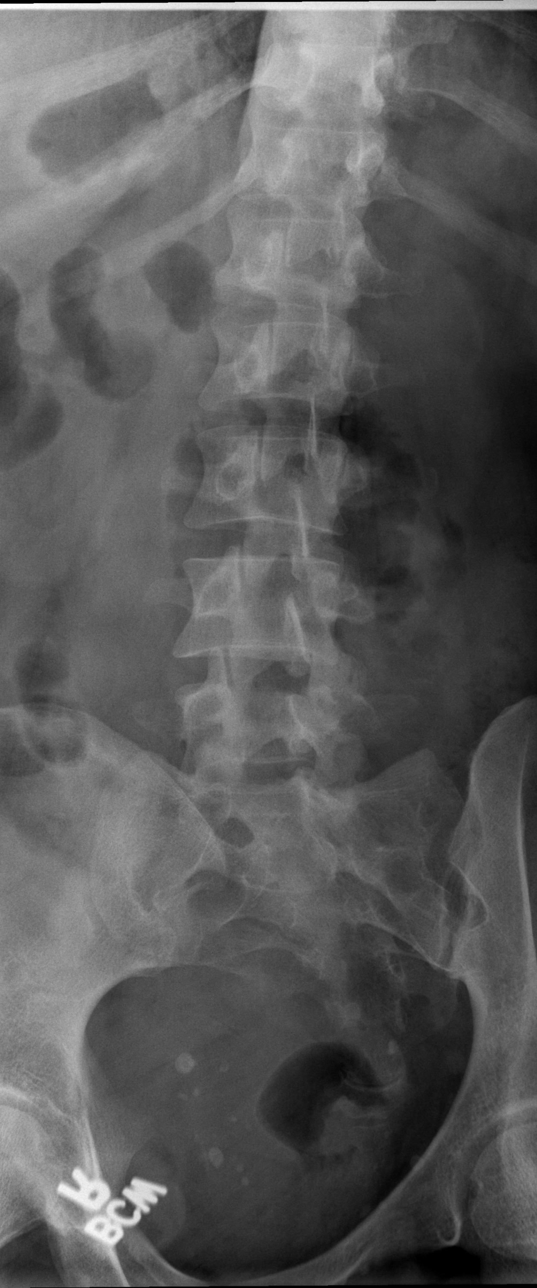

[t lumbar spine obl (2 of 2)]
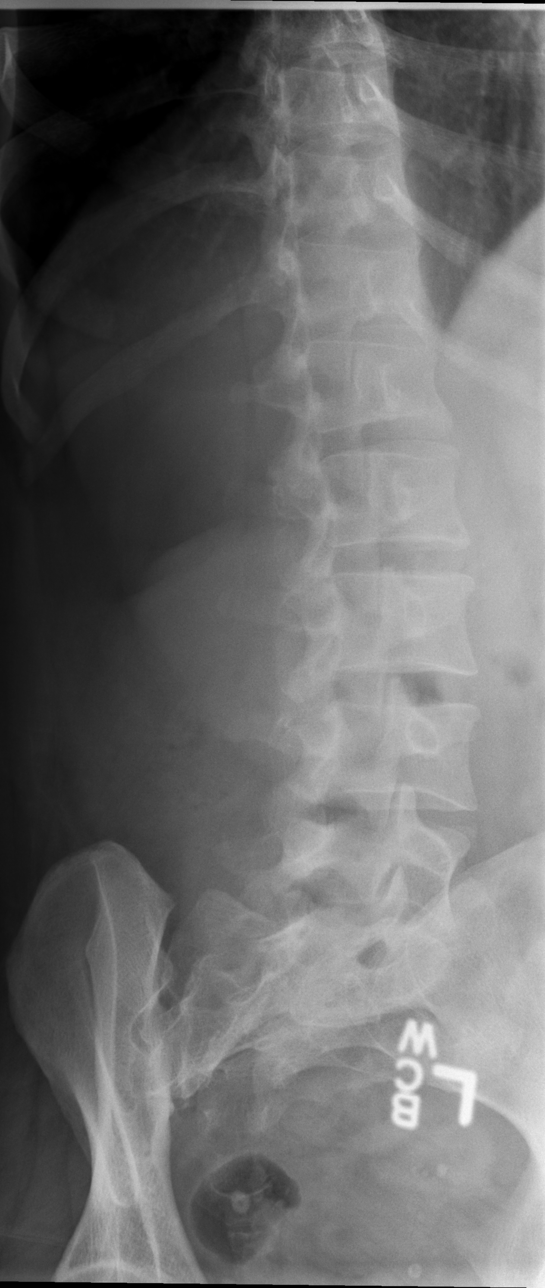

[t lumbar spine lat]
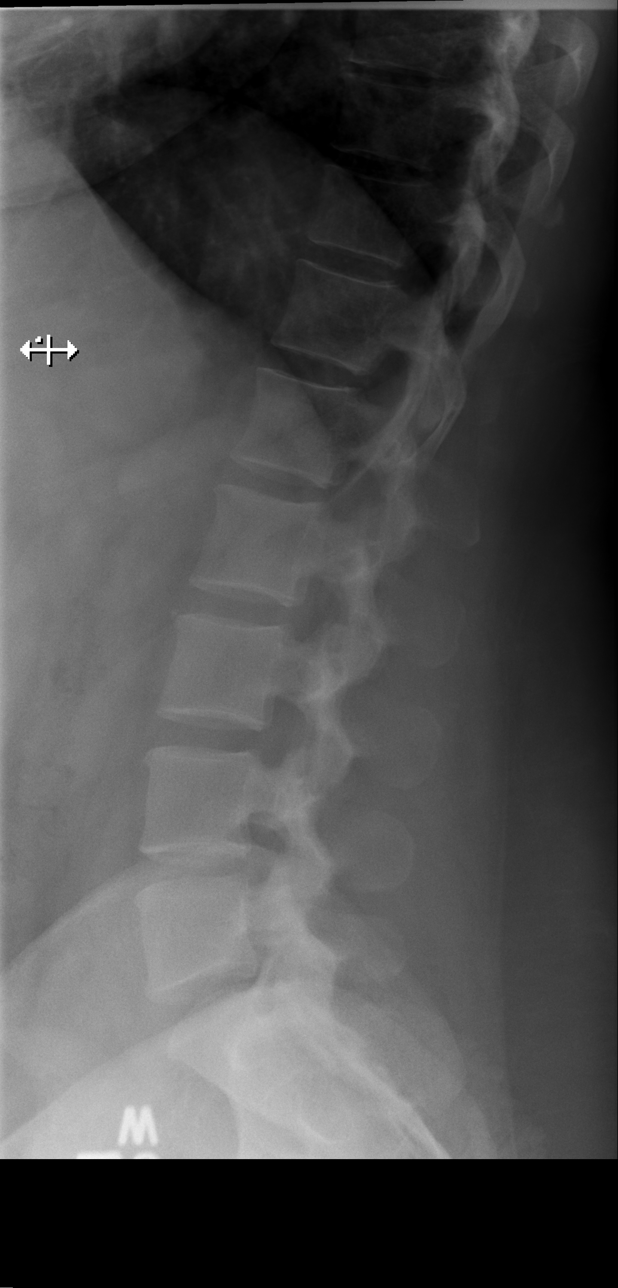

[t lumbar l-5 s-1 spot]
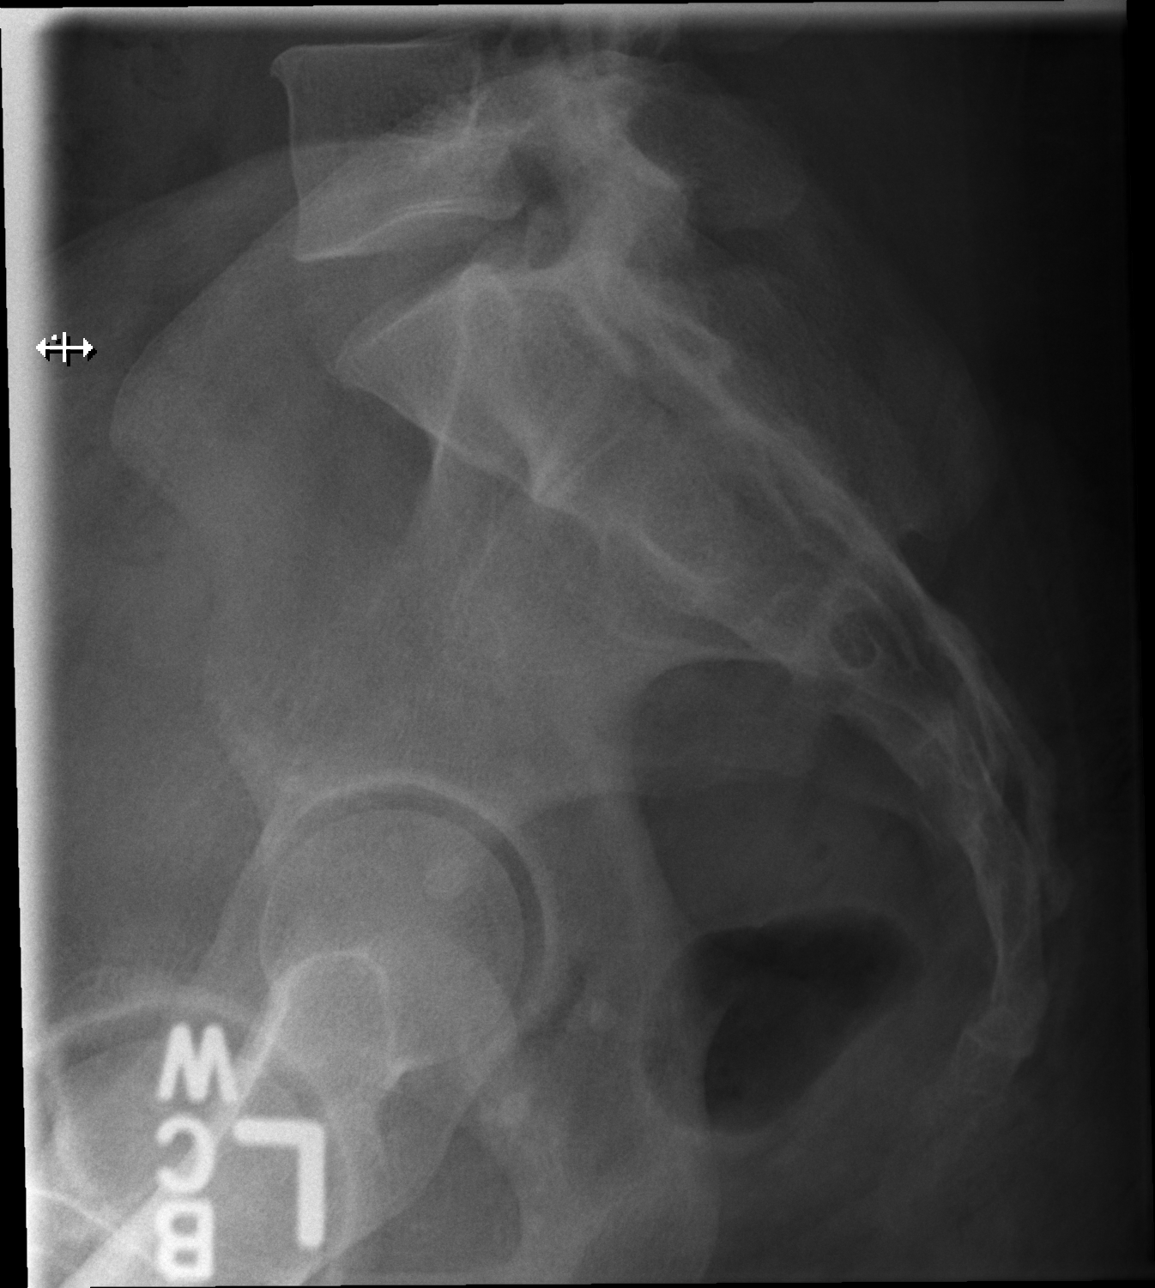

[5 of 5 positions shown; findings below may reference images not displayed]

FINDINGS: There is no evidence of lumbar spine fracture. Alignment is normal.
Intervertebral disc spaces are maintained. No evidence of facet
arthropathy or other bone lesions.
IMPRESSION: Negative lumbar spine radiographs.

## 2018-07-22 DIAGNOSIS — F17210 Nicotine dependence, cigarettes, uncomplicated: Secondary | ICD-10-CM | POA: Diagnosis not present

## 2018-07-22 DIAGNOSIS — F603 Borderline personality disorder: Secondary | ICD-10-CM | POA: Diagnosis not present

## 2018-07-22 DIAGNOSIS — J302 Other seasonal allergic rhinitis: Secondary | ICD-10-CM | POA: Diagnosis not present

## 2018-07-22 DIAGNOSIS — F419 Anxiety disorder, unspecified: Secondary | ICD-10-CM | POA: Diagnosis not present

## 2018-07-22 DIAGNOSIS — F410 Panic disorder [episodic paroxysmal anxiety] without agoraphobia: Secondary | ICD-10-CM | POA: Diagnosis not present

## 2018-07-22 DIAGNOSIS — E119 Type 2 diabetes mellitus without complications: Secondary | ICD-10-CM | POA: Diagnosis not present

## 2018-07-22 DIAGNOSIS — F4310 Post-traumatic stress disorder, unspecified: Secondary | ICD-10-CM | POA: Diagnosis not present

## 2018-07-22 DIAGNOSIS — Z720 Tobacco use: Secondary | ICD-10-CM | POA: Diagnosis not present

## 2018-07-22 DIAGNOSIS — E039 Hypothyroidism, unspecified: Secondary | ICD-10-CM | POA: Diagnosis not present

## 2018-07-22 DIAGNOSIS — J45909 Unspecified asthma, uncomplicated: Secondary | ICD-10-CM | POA: Diagnosis not present

## 2018-09-03 DIAGNOSIS — G8929 Other chronic pain: Secondary | ICD-10-CM | POA: Diagnosis not present

## 2018-09-03 DIAGNOSIS — M5416 Radiculopathy, lumbar region: Secondary | ICD-10-CM | POA: Diagnosis not present
